# Patient Record
Sex: Female | Born: 2005
Health system: Southern US, Community
[De-identification: ages and names within clinical notes are randomized; demographics above are authoritative.]

## PROBLEM LIST (undated history)

## (undated) DIAGNOSIS — G43909 Migraine, unspecified, not intractable, without status migrainosus: Secondary | ICD-10-CM

## (undated) DIAGNOSIS — T7840XA Allergy, unspecified, initial encounter: Secondary | ICD-10-CM

## (undated) HISTORY — DX: Allergy, unspecified, initial encounter: T78.40XA

## (undated) HISTORY — DX: Migraine, unspecified, not intractable, without status migrainosus: G43.909

---
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
## 2023-01-08 ENCOUNTER — Other Ambulatory Visit: Payer: Self-pay

## 2023-01-08 DIAGNOSIS — S43431A Superior glenoid labrum lesion of right shoulder, initial encounter: Secondary | ICD-10-CM | POA: Diagnosis not present

## 2023-03-08 DIAGNOSIS — S43431A Superior glenoid labrum lesion of right shoulder, initial encounter: Secondary | ICD-10-CM | POA: Diagnosis not present

## 2023-03-16 ENCOUNTER — Other Ambulatory Visit: Payer: Self-pay

## 2023-03-16 DIAGNOSIS — M67813 Other specified disorders of tendon, right shoulder: Secondary | ICD-10-CM | POA: Diagnosis not present

## 2023-03-16 DIAGNOSIS — Y999 Unspecified external cause status: Secondary | ICD-10-CM | POA: Diagnosis not present

## 2023-03-16 DIAGNOSIS — M7521 Bicipital tendinitis, right shoulder: Secondary | ICD-10-CM | POA: Diagnosis not present

## 2023-03-16 DIAGNOSIS — S43431A Superior glenoid labrum lesion of right shoulder, initial encounter: Secondary | ICD-10-CM | POA: Diagnosis not present

## 2023-03-16 DIAGNOSIS — G8918 Other acute postprocedural pain: Secondary | ICD-10-CM | POA: Diagnosis not present

## 2023-03-16 MED ORDER — NAPROXEN 500 MG PO TABS
500.0000 mg | ORAL_TABLET | Freq: Two times a day (BID) | ORAL | 1 refills | Status: DC
  Filled 2023-03-16: qty 12, 6d supply, fill #0
  Filled 2023-03-16: qty 48, 24d supply, fill #0

## 2023-03-16 MED ORDER — CYCLOBENZAPRINE HCL 10 MG PO TABS
10.0000 mg | ORAL_TABLET | Freq: Four times a day (QID) | ORAL | 0 refills | Status: DC | PRN
  Filled 2023-03-16: qty 30, 10d supply, fill #0

## 2023-03-16 MED ORDER — ONDANSETRON HCL 4 MG PO TABS
4.0000 mg | ORAL_TABLET | Freq: Four times a day (QID) | ORAL | 0 refills | Status: DC | PRN
  Filled 2023-03-16: qty 10, 3d supply, fill #0

## 2023-03-16 MED ORDER — HYDROCODONE-ACETAMINOPHEN 5-325 MG PO TABS
1.0000 | ORAL_TABLET | Freq: Four times a day (QID) | ORAL | 0 refills | Status: DC | PRN
  Filled 2023-03-16: qty 16, 4d supply, fill #0

## 2023-03-17 ENCOUNTER — Other Ambulatory Visit: Payer: Self-pay

## 2023-03-19 ENCOUNTER — Other Ambulatory Visit: Payer: Self-pay

## 2023-04-07 DIAGNOSIS — M25511 Pain in right shoulder: Secondary | ICD-10-CM | POA: Diagnosis not present

## 2023-04-09 DIAGNOSIS — M25511 Pain in right shoulder: Secondary | ICD-10-CM | POA: Diagnosis not present

## 2023-04-13 DIAGNOSIS — M25511 Pain in right shoulder: Secondary | ICD-10-CM | POA: Diagnosis not present

## 2023-04-16 DIAGNOSIS — M25511 Pain in right shoulder: Secondary | ICD-10-CM | POA: Diagnosis not present

## 2023-04-21 DIAGNOSIS — M25511 Pain in right shoulder: Secondary | ICD-10-CM | POA: Diagnosis not present

## 2023-04-23 DIAGNOSIS — M25511 Pain in right shoulder: Secondary | ICD-10-CM | POA: Diagnosis not present

## 2023-04-27 DIAGNOSIS — M25511 Pain in right shoulder: Secondary | ICD-10-CM | POA: Diagnosis not present

## 2023-04-30 DIAGNOSIS — M25511 Pain in right shoulder: Secondary | ICD-10-CM | POA: Diagnosis not present

## 2023-05-10 DIAGNOSIS — M25511 Pain in right shoulder: Secondary | ICD-10-CM | POA: Diagnosis not present

## 2023-05-12 DIAGNOSIS — M25511 Pain in right shoulder: Secondary | ICD-10-CM | POA: Diagnosis not present

## 2023-05-17 ENCOUNTER — Other Ambulatory Visit: Payer: Self-pay | Admitting: Internal Medicine

## 2023-05-17 DIAGNOSIS — M25511 Pain in right shoulder: Secondary | ICD-10-CM | POA: Diagnosis not present

## 2023-05-18 ENCOUNTER — Other Ambulatory Visit: Payer: Self-pay

## 2023-05-18 MED ORDER — NORGESTIM-ETH ESTRAD TRIPHASIC 0.18/0.215/0.25 MG-35 MCG PO TABS
ORAL_TABLET | ORAL | 2 refills | Status: AC
  Filled 2023-05-18: qty 84, 84d supply, fill #0
  Filled 2023-08-29: qty 84, 84d supply, fill #1

## 2023-05-19 DIAGNOSIS — M25511 Pain in right shoulder: Secondary | ICD-10-CM | POA: Diagnosis not present

## 2023-05-21 ENCOUNTER — Other Ambulatory Visit: Payer: Self-pay

## 2023-05-24 DIAGNOSIS — M25511 Pain in right shoulder: Secondary | ICD-10-CM | POA: Diagnosis not present

## 2023-05-25 ENCOUNTER — Other Ambulatory Visit: Payer: Self-pay

## 2023-05-26 DIAGNOSIS — M25511 Pain in right shoulder: Secondary | ICD-10-CM | POA: Diagnosis not present

## 2023-05-31 DIAGNOSIS — M25511 Pain in right shoulder: Secondary | ICD-10-CM | POA: Diagnosis not present

## 2023-06-01 ENCOUNTER — Encounter: Payer: Self-pay | Admitting: Internal Medicine

## 2023-06-01 ENCOUNTER — Other Ambulatory Visit (HOSPITAL_COMMUNITY)
Admission: RE | Admit: 2023-06-01 | Discharge: 2023-06-01 | Disposition: A | Discharge status: Home / Self Care | Payer: Commercial Managed Care - PPO | Source: Ambulatory Visit | Attending: Internal Medicine | Admitting: Internal Medicine

## 2023-06-01 ENCOUNTER — Ambulatory Visit (INDEPENDENT_AMBULATORY_CARE_PROVIDER_SITE_OTHER): Payer: Commercial Managed Care - PPO | Admitting: Internal Medicine

## 2023-06-01 VITALS — BP 128/66 | HR 76 | Ht 66.0 in | Wt 175.6 lb

## 2023-06-01 DIAGNOSIS — Z113 Encounter for screening for infections with a predominantly sexual mode of transmission: Secondary | ICD-10-CM

## 2023-06-01 DIAGNOSIS — Z0000 Encounter for general adult medical examination without abnormal findings: Secondary | ICD-10-CM | POA: Diagnosis not present

## 2023-06-01 DIAGNOSIS — Z13220 Encounter for screening for lipoid disorders: Secondary | ICD-10-CM

## 2023-06-01 LAB — COMPREHENSIVE METABOLIC PANEL
ALT: 12 U/L (ref 0–35)
AST: 16 U/L (ref 0–37)
Albumin: 4.1 g/dL (ref 3.5–5.2)
Alkaline Phosphatase: 89 U/L (ref 47–119)
BUN: 9 mg/dL (ref 6–23)
CO2: 23 mEq/L (ref 19–32)
Calcium: 10 mg/dL (ref 8.4–10.5)
Chloride: 104 mEq/L (ref 96–112)
Creatinine, Ser: 0.7 mg/dL (ref 0.40–1.20)
GFR: 125.75 mL/min (ref >60.00)
Glucose, Bld: 79 mg/dL (ref 70–99)
Potassium: 4.2 mEq/L (ref 3.5–5.1)
Sodium: 136 mEq/L (ref 135–145)
Total Bilirubin: 0.4 mg/dL (ref 0.3–1.2)
Total Protein: 7.4 g/dL (ref 6.0–8.3)

## 2023-06-01 LAB — CBC WITH DIFFERENTIAL/PLATELET
Basophils Absolute: 0 10*3/uL (ref 0.0–0.1)
Basophils Relative: 0.3 % (ref 0.0–3.0)
Eosinophils Absolute: 0.2 10*3/uL (ref 0.0–0.7)
Eosinophils Relative: 3 % (ref 0.0–5.0)
HCT: 38.9 % (ref 36.0–49.0)
Hemoglobin: 12.8 g/dL (ref 12.0–16.0)
Lymphocytes Relative: 22.7 % — ABNORMAL LOW (ref 24.0–48.0)
Lymphs Abs: 1.5 10*3/uL (ref 0.7–4.0)
MCHC: 32.9 g/dL (ref 31.0–37.0)
MCV: 87 fl (ref 78.0–98.0)
Monocytes Absolute: 0.4 10*3/uL (ref 0.1–1.0)
Monocytes Relative: 6.1 % (ref 3.0–12.0)
Neutro Abs: 4.6 10*3/uL (ref 1.4–7.7)
Neutrophils Relative %: 67.9 % (ref 43.0–71.0)
Platelets: 351 10*3/uL (ref 150.0–575.0)
RBC: 4.47 Mil/uL (ref 3.80–5.70)
RDW: 13.3 % (ref 11.4–15.5)
WBC: 6.8 10*3/uL (ref 4.5–13.5)

## 2023-06-01 LAB — LIPID PANEL
Cholesterol: 176 mg/dL (ref 0–200)
HDL: 59.3 mg/dL (ref >39.00)
LDL Cholesterol: 88 mg/dL (ref 0–99)
NonHDL: 116.23
Total CHOL/HDL Ratio: 3
Triglycerides: 143 mg/dL (ref 0.0–149.0)
VLDL: 28.6 mg/dL (ref 0.0–40.0)

## 2023-06-01 LAB — TSH: TSH: 3.42 u[IU]/mL (ref 0.40–5.00)

## 2023-06-01 NOTE — Patient Instructions (Addendum)
Midol without caffeine is basically tylenol plus a mild diuretec,  you can add aleve to this if needed   Colace,  is the stool softener that can help with constipation    Use afrin nasal spray for up to 5 days,  or sudafed PE every 6 hours for sinus congestion an ear pain   Gargle with salt water for sore throat

## 2023-06-01 NOTE — Assessment & Plan Note (Addendum)
age appropriate education and counseling updated, referrals for preventative services and immunizations addressed, dietary and smoking counseling addressed, most recent labs reviewed.and reordered.  Patient states that she has never been sexually active,  pelvic exam therefore exaplained but deferred.  STD screening by urine and blood ordered.    I have personally reviewed and have noted:   1) the patient's medical and social history 2) The pt's use of alcohol, tobacco, and illicit drugs 3) The patient's current medications and supplements 4) Functional ability including ADL's, fall risk, home safety risk, hearing and visual impairment 5) Diet and physical activities 6) Evidence for depression or mood disorder 7) The patient's height, weight, and BMI have been recorded in the chart     I have made referrals, and provided counseling and education based on review of the above

## 2023-06-01 NOTE — Progress Notes (Signed)
Patient ID: ZOEEY STUMBAUGH, female    DOB: [birth year]  Age: 17 y.o. MRN: [id number]  The patient is here for annual preventive examination and management of other chronic and acute problems.   The risk factors are reflected in the social history.   The roster of all physicians providing medical care to patient - is listed in the Snapshot section of the chart.   Activities of daily living:  The patient is 100% independent in all ADLs: dressing, toileting, feeding as well as independent mobility   Home safety : The patient has smoke detectors in the home. They wear seatbelts.  There are no unsecured firearms at home. There is no violence in the home.    There is no known risks for hepatitis, STDs or HIV. There is no   history of blood transfusion. They have no travel history to infectious disease endemic areas of the world.   The patient has seen their dentist in the last six month. They have seen their eye doctor in the last year. They do not  have excessive sun exposure. Discussed the need for sun protection: hats, long sleeves and use of sunscreen if there is significant sun exposure.    Diet: the importance of a healthy diet is discussed. They do have a healthy diet.   The benefits of regular aerobic exercise were discussed. The patient  exercises  5 days per week  for  60 minutes.    Depression screen: there are no signs or vegative symptoms of depression- irritability, change in appetite, anhedonia, sadness/tearfullness.   The following portions of the patient's history were reviewed and updated as appropriate: allergies, current medications, past family history, past medical history,  past surgical history, past social history  and problem list.   Visual acuity was not assessed per patient preference since the patient has regular follow up with an  ophthalmologist. Hearing and body mass index were assessed and reviewed.    During the course of the visit the patient was educated and  counseled about appropriate screening and preventive services including : fall prevention , diabetes screening, nutrition counseling, colorectal cancer screening, and recommended immunizations.    Chief Complaint:  Recovering from right shoulder surgery May 21 by Supple for torn labrum.    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   practice starts in September   Ocular migraines occurring on days of travel with team. Using excedrin at onset of aura  .  Headaches occurring not more than once a month  Periods  using midol w/caffeine for management of intense cramping 10 days prior to menses    Review of Symptoms  Patient denies headache, fevers, malaise, unintentional weight loss, skin rash, eye pain, sinus congestion and sinus pain, sore throat, dysphagia,  hemoptysis , cough, dyspnea, wheezing, chest pain, palpitations, orthopnea, edema, abdominal pain, [place], melena, diarrhea, constipation, flank pain, dysuria, hematuria, urinary  Frequency, nocturia, numbness, tingling, seizures,  Focal weakness, Loss of consciousness,  Tremor, insomnia, depression, anxiety, and suicidal ideation.    Physical Exam:  BP 128/66   Pulse 76   Ht 5\' 6"  (1.676 m)   Wt 175 lb 9.6 oz (79.7 kg)   SpO2 99%   BMI 28.34 kg/m    Physical Exam Vitals reviewed.  Constitutional:      General: She is not in acute distress.    Appearance: Normal appearance. She is normal weight.  She is not ill-appearing, toxic-appearing or diaphoretic.  HENT:     Head: Normocephalic.  Eyes:     General: No scleral icterus.       Right eye: No discharge.        Left eye: No discharge.     Conjunctiva/sclera: Conjunctivae normal.  Cardiovascular:     Rate and Rhythm: Normal rate and regular rhythm.     Heart sounds: Normal heart sounds.  Pulmonary:     Effort: Pulmonary effort is normal. No respiratory distress.     Breath sounds: Normal breath  sounds.  Musculoskeletal:        General: Normal range of motion.  Skin:    General: Skin is warm and dry.  Neurological:     General: No focal deficit present.     Mental Status: She is alert and oriented to person, place, and time. Mental status is at baseline.  Psychiatric:        Mood and Affect: Mood normal.        Behavior: Behavior normal.        Thought Content: Thought content normal.        Judgment: Judgment normal.     Assessment and Plan: Encounter for preventive health examination Assessment & Plan: age appropriate education and counseling updated, referrals for preventative services and immunizations addressed, dietary and smoking counseling addressed, most recent labs reviewed.and reordered.  Patient states that she has never been sexually active,  pelvic exam therefore exaplained but deferred.  STD screening by urine and blood ordered.    I have personally reviewed and have noted:   1) the patient's medical and social history 2) The pt's use of alcohol, tobacco, and illicit drugs 3) The patient's current medications and supplements 4) Functional ability including ADL's, fall risk, home safety risk, hearing and visual impairment 5) Diet and physical activities 6) Evidence for depression or mood disorder 7) The patient's height, weight, and BMI have been recorded in the chart     I have made referrals, and provided counseling and education based on review of the above  Orders: -     Comprehensive metabolic panel -     CBC with Differential/Platelet -     TSH -     Lipid panel  Screen for STD (sexually transmitted disease) -     Hepatitis C antibody -     HIV Antibody (routine testing w rflx) -     Urine cytology ancillary only    Return in about 1 year (around 05/31/2024).  Sherlene Shams, MD

## 2023-06-03 DIAGNOSIS — M25511 Pain in right shoulder: Secondary | ICD-10-CM | POA: Diagnosis not present

## 2023-06-07 DIAGNOSIS — M25511 Pain in right shoulder: Secondary | ICD-10-CM | POA: Diagnosis not present

## 2023-08-09 DIAGNOSIS — Z4789 Encounter for other orthopedic aftercare: Secondary | ICD-10-CM | POA: Diagnosis not present

## 2023-08-16 DIAGNOSIS — R279 Unspecified lack of coordination: Secondary | ICD-10-CM | POA: Diagnosis not present

## 2023-08-16 DIAGNOSIS — M6281 Muscle weakness (generalized): Secondary | ICD-10-CM | POA: Diagnosis not present

## 2023-08-16 DIAGNOSIS — R531 Weakness: Secondary | ICD-10-CM | POA: Diagnosis not present

## 2023-08-16 DIAGNOSIS — R448 Other symptoms and signs involving general sensations and perceptions: Secondary | ICD-10-CM | POA: Diagnosis not present

## 2023-08-16 DIAGNOSIS — Z4789 Encounter for other orthopedic aftercare: Secondary | ICD-10-CM | POA: Diagnosis not present

## 2023-08-16 DIAGNOSIS — R6889 Other general symptoms and signs: Secondary | ICD-10-CM | POA: Diagnosis not present

## 2023-08-16 DIAGNOSIS — M25511 Pain in right shoulder: Secondary | ICD-10-CM | POA: Diagnosis not present

## 2023-08-16 DIAGNOSIS — S43431D Superior glenoid labrum lesion of right shoulder, subsequent encounter: Secondary | ICD-10-CM | POA: Diagnosis not present

## 2023-08-17 DIAGNOSIS — Z23 Encounter for immunization: Secondary | ICD-10-CM | POA: Diagnosis not present

## 2023-08-17 DIAGNOSIS — R21 Rash and other nonspecific skin eruption: Secondary | ICD-10-CM | POA: Diagnosis not present

## 2023-08-19 DIAGNOSIS — M25511 Pain in right shoulder: Secondary | ICD-10-CM | POA: Diagnosis not present

## 2023-08-19 DIAGNOSIS — M6281 Muscle weakness (generalized): Secondary | ICD-10-CM | POA: Diagnosis not present

## 2023-08-19 DIAGNOSIS — R6889 Other general symptoms and signs: Secondary | ICD-10-CM | POA: Diagnosis not present

## 2023-08-19 DIAGNOSIS — R531 Weakness: Secondary | ICD-10-CM | POA: Diagnosis not present

## 2023-08-19 DIAGNOSIS — R279 Unspecified lack of coordination: Secondary | ICD-10-CM | POA: Diagnosis not present

## 2023-08-19 DIAGNOSIS — R448 Other symptoms and signs involving general sensations and perceptions: Secondary | ICD-10-CM | POA: Diagnosis not present

## 2023-08-19 DIAGNOSIS — Z4789 Encounter for other orthopedic aftercare: Secondary | ICD-10-CM | POA: Diagnosis not present

## 2023-08-19 DIAGNOSIS — S43431D Superior glenoid labrum lesion of right shoulder, subsequent encounter: Secondary | ICD-10-CM | POA: Diagnosis not present

## 2023-08-23 DIAGNOSIS — M6281 Muscle weakness (generalized): Secondary | ICD-10-CM | POA: Diagnosis not present

## 2023-08-23 DIAGNOSIS — R279 Unspecified lack of coordination: Secondary | ICD-10-CM | POA: Diagnosis not present

## 2023-08-23 DIAGNOSIS — R6889 Other general symptoms and signs: Secondary | ICD-10-CM | POA: Diagnosis not present

## 2023-08-23 DIAGNOSIS — R531 Weakness: Secondary | ICD-10-CM | POA: Diagnosis not present

## 2023-08-23 DIAGNOSIS — M25511 Pain in right shoulder: Secondary | ICD-10-CM | POA: Diagnosis not present

## 2023-08-23 DIAGNOSIS — R448 Other symptoms and signs involving general sensations and perceptions: Secondary | ICD-10-CM | POA: Diagnosis not present

## 2023-08-23 DIAGNOSIS — Z4789 Encounter for other orthopedic aftercare: Secondary | ICD-10-CM | POA: Diagnosis not present

## 2023-08-23 DIAGNOSIS — S43431D Superior glenoid labrum lesion of right shoulder, subsequent encounter: Secondary | ICD-10-CM | POA: Diagnosis not present

## 2023-08-26 DIAGNOSIS — R6889 Other general symptoms and signs: Secondary | ICD-10-CM | POA: Diagnosis not present

## 2023-08-26 DIAGNOSIS — R279 Unspecified lack of coordination: Secondary | ICD-10-CM | POA: Diagnosis not present

## 2023-08-26 DIAGNOSIS — Z4789 Encounter for other orthopedic aftercare: Secondary | ICD-10-CM | POA: Diagnosis not present

## 2023-08-26 DIAGNOSIS — R448 Other symptoms and signs involving general sensations and perceptions: Secondary | ICD-10-CM | POA: Diagnosis not present

## 2023-08-26 DIAGNOSIS — R531 Weakness: Secondary | ICD-10-CM | POA: Diagnosis not present

## 2023-08-26 DIAGNOSIS — M25511 Pain in right shoulder: Secondary | ICD-10-CM | POA: Diagnosis not present

## 2023-08-26 DIAGNOSIS — S43431D Superior glenoid labrum lesion of right shoulder, subsequent encounter: Secondary | ICD-10-CM | POA: Diagnosis not present

## 2023-08-26 DIAGNOSIS — M6281 Muscle weakness (generalized): Secondary | ICD-10-CM | POA: Diagnosis not present

## 2023-08-31 DIAGNOSIS — S43431D Superior glenoid labrum lesion of right shoulder, subsequent encounter: Secondary | ICD-10-CM | POA: Diagnosis not present

## 2023-08-31 DIAGNOSIS — R6889 Other general symptoms and signs: Secondary | ICD-10-CM | POA: Diagnosis not present

## 2023-08-31 DIAGNOSIS — M25511 Pain in right shoulder: Secondary | ICD-10-CM | POA: Diagnosis not present

## 2023-08-31 DIAGNOSIS — M6281 Muscle weakness (generalized): Secondary | ICD-10-CM | POA: Diagnosis not present

## 2023-08-31 DIAGNOSIS — Z4789 Encounter for other orthopedic aftercare: Secondary | ICD-10-CM | POA: Diagnosis not present

## 2023-08-31 DIAGNOSIS — R279 Unspecified lack of coordination: Secondary | ICD-10-CM | POA: Diagnosis not present

## 2023-08-31 DIAGNOSIS — R531 Weakness: Secondary | ICD-10-CM | POA: Diagnosis not present

## 2023-08-31 DIAGNOSIS — R448 Other symptoms and signs involving general sensations and perceptions: Secondary | ICD-10-CM | POA: Diagnosis not present

## 2023-09-02 DIAGNOSIS — R279 Unspecified lack of coordination: Secondary | ICD-10-CM | POA: Diagnosis not present

## 2023-09-02 DIAGNOSIS — M25511 Pain in right shoulder: Secondary | ICD-10-CM | POA: Diagnosis not present

## 2023-09-02 DIAGNOSIS — Z4789 Encounter for other orthopedic aftercare: Secondary | ICD-10-CM | POA: Diagnosis not present

## 2023-09-02 DIAGNOSIS — M6281 Muscle weakness (generalized): Secondary | ICD-10-CM | POA: Diagnosis not present

## 2023-09-02 DIAGNOSIS — R531 Weakness: Secondary | ICD-10-CM | POA: Diagnosis not present

## 2023-09-02 DIAGNOSIS — S43431D Superior glenoid labrum lesion of right shoulder, subsequent encounter: Secondary | ICD-10-CM | POA: Diagnosis not present

## 2023-09-02 DIAGNOSIS — R6889 Other general symptoms and signs: Secondary | ICD-10-CM | POA: Diagnosis not present

## 2023-09-02 DIAGNOSIS — R448 Other symptoms and signs involving general sensations and perceptions: Secondary | ICD-10-CM | POA: Diagnosis not present

## 2023-09-07 DIAGNOSIS — R279 Unspecified lack of coordination: Secondary | ICD-10-CM | POA: Diagnosis not present

## 2023-09-07 DIAGNOSIS — R448 Other symptoms and signs involving general sensations and perceptions: Secondary | ICD-10-CM | POA: Diagnosis not present

## 2023-09-07 DIAGNOSIS — M25511 Pain in right shoulder: Secondary | ICD-10-CM | POA: Diagnosis not present

## 2023-09-07 DIAGNOSIS — Z4789 Encounter for other orthopedic aftercare: Secondary | ICD-10-CM | POA: Diagnosis not present

## 2023-09-07 DIAGNOSIS — M6281 Muscle weakness (generalized): Secondary | ICD-10-CM | POA: Diagnosis not present

## 2023-09-07 DIAGNOSIS — R6889 Other general symptoms and signs: Secondary | ICD-10-CM | POA: Diagnosis not present

## 2023-09-07 DIAGNOSIS — R531 Weakness: Secondary | ICD-10-CM | POA: Diagnosis not present

## 2023-09-07 DIAGNOSIS — S43431D Superior glenoid labrum lesion of right shoulder, subsequent encounter: Secondary | ICD-10-CM | POA: Diagnosis not present

## 2023-09-09 DIAGNOSIS — R448 Other symptoms and signs involving general sensations and perceptions: Secondary | ICD-10-CM | POA: Diagnosis not present

## 2023-09-09 DIAGNOSIS — R531 Weakness: Secondary | ICD-10-CM | POA: Diagnosis not present

## 2023-09-09 DIAGNOSIS — R6889 Other general symptoms and signs: Secondary | ICD-10-CM | POA: Diagnosis not present

## 2023-09-09 DIAGNOSIS — M6281 Muscle weakness (generalized): Secondary | ICD-10-CM | POA: Diagnosis not present

## 2023-09-09 DIAGNOSIS — S43431D Superior glenoid labrum lesion of right shoulder, subsequent encounter: Secondary | ICD-10-CM | POA: Diagnosis not present

## 2023-09-09 DIAGNOSIS — Z4789 Encounter for other orthopedic aftercare: Secondary | ICD-10-CM | POA: Diagnosis not present

## 2023-09-09 DIAGNOSIS — M25511 Pain in right shoulder: Secondary | ICD-10-CM | POA: Diagnosis not present

## 2023-09-09 DIAGNOSIS — R279 Unspecified lack of coordination: Secondary | ICD-10-CM | POA: Diagnosis not present

## 2023-09-14 DIAGNOSIS — R531 Weakness: Secondary | ICD-10-CM | POA: Diagnosis not present

## 2023-09-14 DIAGNOSIS — R448 Other symptoms and signs involving general sensations and perceptions: Secondary | ICD-10-CM | POA: Diagnosis not present

## 2023-09-14 DIAGNOSIS — R279 Unspecified lack of coordination: Secondary | ICD-10-CM | POA: Diagnosis not present

## 2023-09-14 DIAGNOSIS — M6281 Muscle weakness (generalized): Secondary | ICD-10-CM | POA: Diagnosis not present

## 2023-09-14 DIAGNOSIS — Z4789 Encounter for other orthopedic aftercare: Secondary | ICD-10-CM | POA: Diagnosis not present

## 2023-09-14 DIAGNOSIS — M25511 Pain in right shoulder: Secondary | ICD-10-CM | POA: Diagnosis not present

## 2023-09-14 DIAGNOSIS — S43431D Superior glenoid labrum lesion of right shoulder, subsequent encounter: Secondary | ICD-10-CM | POA: Diagnosis not present

## 2023-09-14 DIAGNOSIS — R6889 Other general symptoms and signs: Secondary | ICD-10-CM | POA: Diagnosis not present

## 2023-09-16 DIAGNOSIS — R448 Other symptoms and signs involving general sensations and perceptions: Secondary | ICD-10-CM | POA: Diagnosis not present

## 2023-09-16 DIAGNOSIS — R6889 Other general symptoms and signs: Secondary | ICD-10-CM | POA: Diagnosis not present

## 2023-09-16 DIAGNOSIS — R531 Weakness: Secondary | ICD-10-CM | POA: Diagnosis not present

## 2023-09-16 DIAGNOSIS — R279 Unspecified lack of coordination: Secondary | ICD-10-CM | POA: Diagnosis not present

## 2023-09-16 DIAGNOSIS — M6281 Muscle weakness (generalized): Secondary | ICD-10-CM | POA: Diagnosis not present

## 2023-09-16 DIAGNOSIS — Z4789 Encounter for other orthopedic aftercare: Secondary | ICD-10-CM | POA: Diagnosis not present

## 2023-09-16 DIAGNOSIS — M25511 Pain in right shoulder: Secondary | ICD-10-CM | POA: Diagnosis not present

## 2023-09-16 DIAGNOSIS — S43431D Superior glenoid labrum lesion of right shoulder, subsequent encounter: Secondary | ICD-10-CM | POA: Diagnosis not present

## 2023-09-21 ENCOUNTER — Other Ambulatory Visit: Payer: Self-pay

## 2023-09-21 DIAGNOSIS — L700 Acne vulgaris: Secondary | ICD-10-CM | POA: Diagnosis not present

## 2023-09-21 DIAGNOSIS — H00014 Hordeolum externum left upper eyelid: Secondary | ICD-10-CM | POA: Diagnosis not present

## 2023-09-21 MED ORDER — DROSPIRENONE-ETHINYL ESTRADIOL 3-0.02 MG PO TABS
1.0000 | ORAL_TABLET | Freq: Every day | ORAL | 2 refills | Status: AC
  Filled 2023-09-21: qty 84, 84d supply, fill #0
  Filled 2023-12-20: qty 84, 84d supply, fill #1
  Filled 2024-03-17: qty 84, 84d supply, fill #2
  Filled 2024-03-18: qty 84, 84d supply, fill #0

## 2023-09-27 ENCOUNTER — Other Ambulatory Visit: Payer: Self-pay

## 2023-09-30 DIAGNOSIS — M6281 Muscle weakness (generalized): Secondary | ICD-10-CM | POA: Diagnosis not present

## 2023-09-30 DIAGNOSIS — Z4789 Encounter for other orthopedic aftercare: Secondary | ICD-10-CM | POA: Diagnosis not present

## 2023-09-30 DIAGNOSIS — R531 Weakness: Secondary | ICD-10-CM | POA: Diagnosis not present

## 2023-09-30 DIAGNOSIS — S43431D Superior glenoid labrum lesion of right shoulder, subsequent encounter: Secondary | ICD-10-CM | POA: Diagnosis not present

## 2023-09-30 DIAGNOSIS — R279 Unspecified lack of coordination: Secondary | ICD-10-CM | POA: Diagnosis not present

## 2023-09-30 DIAGNOSIS — R448 Other symptoms and signs involving general sensations and perceptions: Secondary | ICD-10-CM | POA: Diagnosis not present

## 2023-09-30 DIAGNOSIS — R6889 Other general symptoms and signs: Secondary | ICD-10-CM | POA: Diagnosis not present

## 2023-09-30 DIAGNOSIS — M25511 Pain in right shoulder: Secondary | ICD-10-CM | POA: Diagnosis not present

## 2023-10-05 DIAGNOSIS — R279 Unspecified lack of coordination: Secondary | ICD-10-CM | POA: Diagnosis not present

## 2023-10-05 DIAGNOSIS — M25511 Pain in right shoulder: Secondary | ICD-10-CM | POA: Diagnosis not present

## 2023-10-05 DIAGNOSIS — R531 Weakness: Secondary | ICD-10-CM | POA: Diagnosis not present

## 2023-10-05 DIAGNOSIS — Z4789 Encounter for other orthopedic aftercare: Secondary | ICD-10-CM | POA: Diagnosis not present

## 2023-10-05 DIAGNOSIS — R6889 Other general symptoms and signs: Secondary | ICD-10-CM | POA: Diagnosis not present

## 2023-10-05 DIAGNOSIS — R448 Other symptoms and signs involving general sensations and perceptions: Secondary | ICD-10-CM | POA: Diagnosis not present

## 2023-10-05 DIAGNOSIS — S43431D Superior glenoid labrum lesion of right shoulder, subsequent encounter: Secondary | ICD-10-CM | POA: Diagnosis not present

## 2023-10-05 DIAGNOSIS — M6281 Muscle weakness (generalized): Secondary | ICD-10-CM | POA: Diagnosis not present

## 2023-12-20 ENCOUNTER — Other Ambulatory Visit: Payer: Self-pay

## 2023-12-22 DIAGNOSIS — M545 Low back pain, unspecified: Secondary | ICD-10-CM | POA: Diagnosis not present

## 2023-12-27 ENCOUNTER — Other Ambulatory Visit: Payer: Self-pay

## 2023-12-27 DIAGNOSIS — L700 Acne vulgaris: Secondary | ICD-10-CM | POA: Diagnosis not present

## 2023-12-27 MED ORDER — DROSPIRENONE-ETHINYL ESTRADIOL 3-0.02 MG PO TABS
1.0000 | ORAL_TABLET | Freq: Every day | ORAL | 3 refills | Status: AC
  Filled 2023-12-27 – 2024-05-31 (×2): qty 84, 84d supply, fill #0
  Filled 2024-08-06 – 2024-08-07 (×2): qty 84, 84d supply, fill #1
  Filled 2024-10-23 – 2024-10-25 (×2): qty 84, 84d supply, fill #2

## 2024-03-18 ENCOUNTER — Other Ambulatory Visit (HOSPITAL_COMMUNITY): Payer: Self-pay

## 2024-05-18 ENCOUNTER — Other Ambulatory Visit: Payer: Self-pay

## 2024-05-19 ENCOUNTER — Other Ambulatory Visit: Payer: Self-pay

## 2024-05-22 ENCOUNTER — Other Ambulatory Visit: Payer: Self-pay

## 2024-05-26 ENCOUNTER — Other Ambulatory Visit (HOSPITAL_COMMUNITY): Payer: Self-pay

## 2024-05-31 ENCOUNTER — Other Ambulatory Visit: Payer: Self-pay

## 2024-06-01 ENCOUNTER — Ambulatory Visit (INDEPENDENT_AMBULATORY_CARE_PROVIDER_SITE_OTHER): Admitting: Nurse Practitioner

## 2024-06-01 ENCOUNTER — Encounter: Payer: Self-pay | Admitting: Nurse Practitioner

## 2024-06-01 ENCOUNTER — Other Ambulatory Visit (HOSPITAL_COMMUNITY)
Admission: RE | Admit: 2024-06-01 | Discharge: 2024-06-01 | Disposition: A | Discharge status: Home / Self Care | Source: Ambulatory Visit | Attending: Nurse Practitioner | Admitting: Nurse Practitioner

## 2024-06-01 VITALS — BP 116/78 | HR 66 | Temp 97.8°F | Ht 66.03 in | Wt 173.8 lb

## 2024-06-01 DIAGNOSIS — Z0000 Encounter for general adult medical examination without abnormal findings: Secondary | ICD-10-CM

## 2024-06-01 NOTE — Progress Notes (Signed)
 Established Patient Office Visit  Subjective:  Patient ID: Kathleen Simon, female    DOB: 02-Apr-2006  Age: 18 y.o. MRN: [id number]  CC:  Chief Complaint  Patient presents with   Annual Exam   Discussed the use of a AI scribe software for clinical note transcription with the patient, who gave verbal consent to proceed.  HPI  Kathleen Simon presents for annual physical. 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  Diet: Patient consume meat. Patient consumes fruits and veggies. Patient eat some fried food. Patient drinks water, sweet tea and coffee. Occasionally soda.  Exercise: Lifting, yoga and sports Vaccine  Flu: 2024 Tetanus: 07/24/2014 COVID:[id number] HPV: Completed   Family history:  Colon cancer: no  Breast cancer:Paternal GM  Dentist: every  6 montha Ophthalmology: Due HIV screening: completed Hep C screening: Completed Tobacco use: No Alcohol use: No  Illicit drugs: No Sexual activity: Not sexually active, on OCP for acne.   HPI   Past Medical History:  Diagnosis Date   Allergy May 2023   Minocycline    Migraines     History reviewed. No pertinent surgical history.  Family History  Problem Relation Age of Onset   Cancer Paternal Grandmother        Breast cancer    Social History   Socioeconomic History   Marital status: Single    Spouse name: Not on file   Number of children: Not on file   Years of education: Not on file   Highest education level: Not on file  Occupational History   Not on file  Tobacco Use   Smoking status: Never   Smokeless tobacco: Never  Substance and Sexual Activity   Alcohol use: Never   Drug use: Never   Sexual activity: Not Currently    Birth control/protection: Pill  Other Topics Concern   Not on file  Social History Narrative   Not on file   Social Drivers of Health   Financial Resource Strain: Not on file  Food Insecurity: Not on file   Transportation Needs: Not on file  Physical Activity: Not on file  Stress: Not on file  Social Connections: Unknown (04/13/2023)   Received from [facility]    How often do you feel lonely or isolated from those around you? (Adult - for ages 19 years and over): Not on file  Intimate Partner Violence: Not on file     Outpatient Medications Prior to Visit  Medication Sig Dispense Refill   drospirenone -ethinyl estradiol  (YAZ) 3-0.02 MG tablet Take 1 tablet by mouth daily. 84 tablet 2   drospirenone -ethinyl estradiol  (YAZ) 3-0.02 MG tablet Take 1 tablet by mouth daily. 84 tablet 3   Norgestimate -Ethinyl Estradiol  Triphasic (TRI-SPRINTEC ) 0.18/0.215/0.25 MG-35 MCG tablet Take 1 tab by mouth once a day for 84 days 84 tablet 2   Clascoterone  (WINLEVI ) 1 % CREA Apply 1 Application topically daily. (Patient not taking: Reported on 06/01/2024) 60 g 2   naproxen  (NAPROSYN ) 500 MG tablet Take 1 tablet (500 mg total) by mouth 2 (two) times daily. (Patient not taking: Reported on 06/01/2024) 60 tablet 1   SUMAtriptan  (IMITREX ) 50 MG tablet Take 1 tab by mouth at onset of migraine. Can repeat in 2 hours if needed. Max= 2 tablets in 24 hours (Patient not taking: Reported on 06/01/2024) 12 tablet 1   No facility-administered medications prior to visit.    Allergies  Allergen Reactions  Minocycline  Anaphylaxis    ROS Review of Systems Negative unless indicated in HPI.    Objective:    Physical Exam Constitutional:      Appearance: Normal appearance.  HENT:     Head: Normocephalic.     Right Ear: Tympanic membrane normal.     Left Ear: Tympanic membrane normal.     Mouth/Throat:     Mouth: Mucous membranes are moist.  Eyes:     Conjunctiva/sclera: Conjunctivae normal.     Pupils: Pupils are equal, round, and reactive to light.  Cardiovascular:     Rate and Rhythm: Normal rate and regular rhythm.     Pulses: Normal pulses.     Heart sounds: Normal heart sounds.   Pulmonary:     Effort: Pulmonary effort is normal.     Breath sounds: Normal breath sounds.  Chest:     Chest wall: No mass.  Breasts:    Right: Normal. No mass.     Left: Normal. No mass.  Abdominal:     General: Bowel sounds are normal.     Palpations: Abdomen is soft.  Musculoskeletal:     Cervical back: Normal range of motion. No tenderness.  Lymphadenopathy:     Upper Body:     Right upper body: No axillary adenopathy.     Left upper body: No axillary adenopathy.  Skin:    General: Skin is warm.     Findings: No bruising.  Neurological:     General: No focal deficit present.     Mental Status: She is alert and oriented to person, place, and time. Mental status is at baseline.  Psychiatric:        Mood and Affect: Mood normal.        Behavior: Behavior normal.        Thought Content: Thought content normal.        Judgment: Judgment normal.     BP 116/78   Pulse 66   Temp 97.8 F (36.6 C)   Ht 5' 6.03 (1.677 m)   Wt 173 lb 12.8 oz (78.8 kg)   LMP 05/11/2024   SpO2 99%   BMI 28.03 kg/m  Wt Readings from Last 3 Encounters:  06/01/24 173 lb 12.8 oz (78.8 kg) (93%, Z= 1.46)*  06/01/23 175 lb 9.6 oz (79.7 kg) (94%, Z= 1.55)*  03/30/22 164 lb (74.4 kg) (92%, Z= 1.38)*   * Growth percentiles are based on CDC (Girls, 2-20 Years) data.     Health Maintenance  Topic Date Due   COVID-19 Vaccine (3 - Pfizer risk series) 08/09/2020   INFLUENZA VACCINE  01/23/2025 (Originally 05/26/2024)   DTaP/Tdap/Td (8 - Td or Tdap) 07/24/2024   HPV VACCINES  Completed   Hepatitis C Screening  Completed   HIV Screening  Completed   Meningococcal B Vaccine  Completed   Pneumococcal Vaccine  Aged Out   Hepatitis B Vaccines 19-59 Average Risk  Discontinued    There are no preventive care reminders to display for this patient.  Lab Results  Component Value Date   TSH 4.82 06/01/2024   Lab Results  Component Value Date   WBC 5.6 06/01/2024   HGB 13.0 06/01/2024   HCT 38.6  06/01/2024   MCV 85.7 06/01/2024   PLT 345.0 06/01/2024   Lab Results  Component Value Date   NA 138 06/01/2024   K 3.9 06/01/2024   CO2 26 06/01/2024   GLUCOSE 87 06/01/2024   BUN 8 06/01/2024   CREATININE  0.73 06/01/2024   BILITOT 0.3 06/01/2024   ALKPHOS 91 06/01/2024   AST 13 06/01/2024   ALT 13 06/01/2024   PROT 6.9 06/01/2024   ALBUMIN 4.0 06/01/2024   CALCIUM 9.0 06/01/2024   GFR 118.73 06/01/2024   Lab Results  Component Value Date   CHOL 158 06/01/2024   Lab Results  Component Value Date   HDL 64.40 06/01/2024   Lab Results  Component Value Date   LDLCALC 75 06/01/2024   Lab Results  Component Value Date   TRIG 93.0 06/01/2024   Lab Results  Component Value Date   CHOLHDL 2 06/01/2024   No results found for: HGBA1C    Assessment & Plan:  Encounter for preventive health examination Assessment & Plan:  PHQ-9 score 0 GAD-7 score 2. Up to date on flu, COVID, HPV.  Tetanus vaccine due next month.  Breast examination performed patient tolerated well.  Not checked sexually active, on oral contraceptive for acne managed by dermatology. Encouraged patient to consume a balanced diet and regular exercise regimen. Advised to see an eye doctor and dentist annually.  Labs ordered as outlined   Orders: -     CBC with Differential/Platelet -     Comprehensive metabolic panel with GFR -     Lipid panel -     TSH -     Urine cytology ancillary only    Follow-up: No follow-ups on file.   Ziza Hastings, NP

## 2024-06-01 NOTE — Patient Instructions (Signed)
Preventive Care 18-21 Years Old, Female Preventive care refers to lifestyle choices and visits with your health care provider that can promote health and wellness. At this stage in your life, you may start seeing a primary care physician instead of a pediatrician for your preventive care. Preventive care visits are also called wellness exams. What can I expect for my preventive care visit? Counseling During your preventive care visit, your health care provider may ask about your: Medical history, including: Past medical problems. Family medical history. Pregnancy history. Current health, including: Menstrual cycle. Method of birth control. Emotional well-being. Home life and relationship well-being. Sexual activity and sexual health. Lifestyle, including: Alcohol, nicotine or tobacco, and drug use. Access to firearms. Diet, exercise, and sleep habits. Sunscreen use. Motor vehicle safety. Physical exam Your health care provider may check your: Height and weight. These may be used to calculate your BMI (body mass index). BMI is a measurement that tells if you are at a healthy weight. Waist circumference. This measures the distance around your waistline. This measurement also tells if you are at a healthy weight and may help predict your risk of certain diseases, such as type 2 diabetes and high blood pressure. Heart rate and blood pressure. Body temperature. Skin for abnormal spots. Breasts. What immunizations do I need?  Vaccines are usually given at various ages, according to a schedule. Your health care provider will recommend vaccines for you based on your age, medical history, and lifestyle or other factors, such as travel or where you work. What tests do I need? Screening Your health care provider may recommend screening tests for certain conditions. This may include: Vision and hearing tests. Lipid and cholesterol levels. Pelvic exam and Pap test. Hepatitis B  test. Hepatitis C test. HIV (human immunodeficiency virus) test. STI (sexually transmitted infection) testing, if you are at risk. Tuberculosis skin test if you have symptoms. BRCA-related cancer screening. This may be done if you have a family history of breast, ovarian, tubal, or peritoneal cancers. Talk with your health care provider about your test results, treatment options, and if necessary, the need for more tests. Follow these instructions at home: Eating and drinking  Eat a healthy diet that includes fresh fruits and vegetables, whole grains, lean protein, and low-fat dairy products. Drink enough fluid to keep your urine pale yellow. Do not drink alcohol if: Your health care provider tells you not to drink. You are pregnant, may be pregnant, or are planning to become pregnant. You are under the legal drinking age. In the [place] the legal drinking age is 21. If you drink alcohol: Limit how much you have to 0-1 drink a day. Know how much alcohol is in your drink. In the [place] one drink equals one 12 oz bottle of beer (355 mL), one 5 oz glass of wine (148 mL), or one 1 oz glass of hard liquor (44 mL). Lifestyle Brush your teeth every morning and night with fluoride toothpaste. Floss one time each day. Exercise for at least 30 minutes 5 or more days of the week. Do not use any products that contain nicotine or tobacco. These products include cigarettes, chewing tobacco, and vaping devices, such as e-cigarettes. If you need help quitting, ask your health care provider. Do not use drugs. If you are sexually active, practice safe sex. Use a condom or other form of protection to prevent STIs. If you do not wish to become pregnant, use a form of birth control. If you plan to become pregnant,   see your health care provider for a prepregnancy visit. Find healthy ways to manage stress, such as: Meditation, yoga, or listening to music. Journaling. Talking to a trusted person. Spending time  with friends and family. Safety Always wear your seat belt while driving or riding in a vehicle. Do not drive: If you have been drinking alcohol. Do not ride with someone who has been drinking. When you are tired or distracted. While texting. If you have been using any mind-altering substances or drugs. Wear a helmet and other protective equipment during sports activities. If you have firearms in your house, make sure you follow all gun safety procedures. Seek help if you have been bullied, physically abused, or sexually abused. Use the internet responsibly to avoid dangers, such as online bullying and online sex predators. What's next? Go to your health care provider once a year for an annual wellness visit. Ask your health care provider how often you should have your eyes and teeth checked. Stay up to date on all vaccines. This information is not intended to replace advice given to you by your health care provider. Make sure you discuss any questions you have with your health care provider. Document Revised: 04/09/2021 Document Reviewed: 04/09/2021 Elsevier Patient Education  2024 [employer]  

## 2024-06-02 LAB — COMPREHENSIVE METABOLIC PANEL WITH GFR
ALT: 13 U/L (ref 0–35)
AST: 13 U/L (ref 0–37)
Albumin: 4 g/dL (ref 3.5–5.2)
Alkaline Phosphatase: 91 U/L (ref 47–119)
BUN: 8 mg/dL (ref 6–23)
CO2: 26 meq/L (ref 19–32)
Calcium: 9 mg/dL (ref 8.4–10.5)
Chloride: 103 meq/L (ref 96–112)
Creatinine, Ser: 0.73 mg/dL (ref 0.40–1.20)
GFR: 118.73 mL/min (ref >60.00)
Glucose, Bld: 87 mg/dL (ref 70–99)
Potassium: 3.9 meq/L (ref 3.5–5.1)
Sodium: 138 meq/L (ref 135–145)
Total Bilirubin: 0.3 mg/dL (ref 0.2–1.2)
Total Protein: 6.9 g/dL (ref 6.0–8.3)

## 2024-06-02 LAB — LIPID PANEL
Cholesterol: 158 mg/dL (ref 0–200)
HDL: 64.4 mg/dL (ref >39.00)
LDL Cholesterol: 75 mg/dL (ref 0–99)
NonHDL: 93.3
Total CHOL/HDL Ratio: 2
Triglycerides: 93 mg/dL (ref 0.0–149.0)
VLDL: 18.6 mg/dL (ref 0.0–40.0)

## 2024-06-02 LAB — CBC WITH DIFFERENTIAL/PLATELET
Basophils Absolute: 0 K/uL (ref 0.0–0.1)
Basophils Relative: 0.7 % (ref 0.0–3.0)
Eosinophils Absolute: 0.2 K/uL (ref 0.0–0.7)
Eosinophils Relative: 3.7 % (ref 0.0–5.0)
HCT: 38.6 % (ref 36.0–49.0)
Hemoglobin: 13 g/dL (ref 12.0–16.0)
Lymphocytes Relative: 39.1 % (ref 24.0–48.0)
Lymphs Abs: 2.2 K/uL (ref 0.7–4.0)
MCHC: 33.7 g/dL (ref 31.0–37.0)
MCV: 85.7 fl (ref 78.0–98.0)
Monocytes Absolute: 0.4 K/uL (ref 0.1–1.0)
Monocytes Relative: 6.3 % (ref 3.0–12.0)
Neutro Abs: 2.8 K/uL (ref 1.4–7.7)
Neutrophils Relative %: 50.2 % (ref 43.0–71.0)
Platelets: 345 K/uL (ref 150.0–575.0)
RBC: 4.5 Mil/uL (ref 3.80–5.70)
RDW: 12.9 % (ref 11.4–15.5)
WBC: 5.6 K/uL (ref 4.5–13.5)

## 2024-06-02 LAB — TSH: TSH: 4.82 u[IU]/mL (ref 0.40–5.00)

## 2024-06-05 ENCOUNTER — Encounter: Payer: Commercial Managed Care - PPO | Admitting: Internal Medicine

## 2024-06-05 LAB — URINE CYTOLOGY ANCILLARY ONLY
Chlamydia: NEGATIVE
Comment: NEGATIVE
Comment: NEGATIVE
Comment: NORMAL
Neisseria Gonorrhea: NEGATIVE
Trichomonas: NEGATIVE

## 2024-06-06 ENCOUNTER — Encounter: Admitting: Internal Medicine

## 2024-06-17 ENCOUNTER — Ambulatory Visit: Payer: Self-pay | Admitting: Nurse Practitioner

## 2024-06-17 ENCOUNTER — Encounter: Payer: Self-pay | Admitting: Nurse Practitioner

## 2024-06-17 NOTE — Assessment & Plan Note (Addendum)
 PHQ-9 score 0 GAD-7 score 2. Up to date on flu, COVID, HPV.  Tetanus vaccine due next month.  Breast examination performed patient tolerated well.  Not checked sexually active, on oral contraceptive for acne managed by dermatology. Encouraged patient to consume a balanced diet and regular exercise regimen. Advised to see an eye doctor and dentist annually.  Labs ordered as outlined

## 2024-07-27 DIAGNOSIS — Z23 Encounter for immunization: Secondary | ICD-10-CM | POA: Diagnosis not present

## 2024-08-07 ENCOUNTER — Other Ambulatory Visit: Payer: Self-pay

## 2024-09-18 ENCOUNTER — Encounter: Payer: Self-pay | Admitting: Internal Medicine

## 2024-10-06 ENCOUNTER — Other Ambulatory Visit (INDEPENDENT_AMBULATORY_CARE_PROVIDER_SITE_OTHER)

## 2024-10-06 DIAGNOSIS — L709 Acne, unspecified: Secondary | ICD-10-CM

## 2024-10-06 LAB — TSH: TSH: 5.75 u[IU]/mL — ABNORMAL HIGH (ref 0.35–5.50)

## 2024-10-08 LAB — TESTOSTERONE,FREE AND TOTAL
Testosterone, Free: 0.5 pg/mL (ref 0.0–4.2)
Testosterone: 27 ng/dL (ref 13–71)

## 2024-10-09 ENCOUNTER — Other Ambulatory Visit: Payer: Self-pay | Admitting: Internal Medicine

## 2024-10-10 LAB — DHEA-SULFATE: DHEA-SO4: 128 ug/dL (ref 44–286)

## 2024-10-10 LAB — FSH/LH
FSH: 2.7 m[IU]/mL
LH: 2.8 m[IU]/mL

## 2024-10-10 LAB — 17-HYDROXYPROGESTERONE: 17-OH-Progesterone, LC/MS/MS: <8 ng/dL

## 2024-10-12 ENCOUNTER — Ambulatory Visit: Payer: Self-pay | Admitting: Internal Medicine

## 2024-10-23 ENCOUNTER — Other Ambulatory Visit: Payer: Self-pay

## 2024-10-27 NOTE — Telephone Encounter (Signed)
 Noted

## 2024-11-01 ENCOUNTER — Ambulatory Visit: Admitting: Internal Medicine

## 2025-06-04 ENCOUNTER — Encounter: Admitting: Internal Medicine
